# Patient Record
Sex: Male | Born: 1980
Health system: Southern US, Community
[De-identification: ages and names within clinical notes are randomized; demographics above are authoritative.]

## PROBLEM LIST (undated history)

## (undated) DIAGNOSIS — F411 Generalized anxiety disorder: Secondary | ICD-10-CM

## (undated) DIAGNOSIS — E785 Hyperlipidemia, unspecified: Secondary | ICD-10-CM

## (undated) HISTORY — DX: Hyperlipidemia, unspecified: E78.5

---
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
## 2008-06-21 DIAGNOSIS — E785 Hyperlipidemia, unspecified: Secondary | ICD-10-CM

## 2008-06-21 HISTORY — DX: Hyperlipidemia, unspecified: E78.5

## 2013-03-18 ENCOUNTER — Telehealth: Payer: Self-pay | Admitting: Family Medicine

## 2013-03-18 DIAGNOSIS — E785 Hyperlipidemia, unspecified: Secondary | ICD-10-CM

## 2013-03-18 DIAGNOSIS — D751 Secondary polycythemia: Secondary | ICD-10-CM

## 2013-03-18 NOTE — Telephone Encounter (Signed)
Patient needs order for blood work. °

## 2013-03-19 NOTE — Telephone Encounter (Signed)
Bloodwork ordered in system. Patient was notified.  

## 2013-03-19 NOTE — Telephone Encounter (Signed)
Last bloodwork was 07/02/2008 lipid glucose cbc and tsh.  Problem list includes hyperlip. Does he just need lipid and glucose. Not seen in epic. Last visit March 2013. Not taking any meds. Chart in message pile.

## 2013-03-19 NOTE — Telephone Encounter (Signed)
Please do CBC, lipid profile, glucose. Reason: Hyperlipidemia, polycythemia

## 2013-03-21 LAB — CBC WITH DIFFERENTIAL/PLATELET
BASOS ABS: 0 10*3/uL (ref 0.0–0.1)
Basophils Relative: 0 % (ref 0–1)
Eosinophils Absolute: 0.2 10*3/uL (ref 0.0–0.7)
Eosinophils Relative: 2 % (ref 0–5)
HEMATOCRIT: 45.2 % (ref 39.0–52.0)
Hemoglobin: 15.6 g/dL (ref 13.0–17.0)
LYMPHS PCT: 24 % (ref 12–46)
Lymphs Abs: 1.8 10*3/uL (ref 0.7–4.0)
MCH: 30.6 pg (ref 26.0–34.0)
MCHC: 34.5 g/dL (ref 30.0–36.0)
MCV: 88.6 fL (ref 78.0–100.0)
MONO ABS: 0.7 10*3/uL (ref 0.1–1.0)
Monocytes Relative: 9 % (ref 3–12)
NEUTROS PCT: 65 % (ref 43–77)
Neutro Abs: 4.8 10*3/uL (ref 1.7–7.7)
Platelets: 199 10*3/uL (ref 150–400)
RBC: 5.1 MIL/uL (ref 4.22–5.81)
RDW: 13.2 % (ref 11.5–15.5)
WBC: 7.5 10*3/uL (ref 4.0–10.5)

## 2013-03-21 LAB — LIPID PANEL
CHOLESTEROL: 174 mg/dL (ref 0–200)
HDL: 58 mg/dL (ref >39)
LDL CALC: 105 mg/dL — AB (ref 0–99)
TRIGLYCERIDES: 57 mg/dL (ref <150)
Total CHOL/HDL Ratio: 3 Ratio
VLDL: 11 mg/dL (ref 0–40)

## 2013-03-21 LAB — GLUCOSE, RANDOM: Glucose, Bld: 80 mg/dL (ref 70–99)

## 2013-03-22 ENCOUNTER — Encounter: Payer: Self-pay | Admitting: Family Medicine

## 2013-03-25 ENCOUNTER — Ambulatory Visit (INDEPENDENT_AMBULATORY_CARE_PROVIDER_SITE_OTHER): Payer: BC Managed Care – PPO | Admitting: Family Medicine

## 2013-03-25 ENCOUNTER — Encounter: Payer: Self-pay | Admitting: Family Medicine

## 2013-03-25 ENCOUNTER — Other Ambulatory Visit: Payer: Self-pay | Admitting: Family Medicine

## 2013-03-25 VITALS — BP 120/82 | HR 44 | Ht 70.5 in | Wt 181.0 lb

## 2013-03-25 DIAGNOSIS — Z0000 Encounter for general adult medical examination without abnormal findings: Secondary | ICD-10-CM

## 2013-03-25 DIAGNOSIS — B351 Tinea unguium: Secondary | ICD-10-CM

## 2013-03-25 NOTE — Progress Notes (Signed)
   Subjective:    Patient ID: [name], male    DOB: 06-06-1980, 33 y.o.   MRN: [id number]  HPIPhysical.   Fungus on toenails.  This been present for years but worse over the past several months right great toe causes pain discomfort he has tried topical treatments over-the-counter for months without success he is aware of the oral medications and how they can cause and effect on liver but he also states that he feels he needs to do something because of the discomfort. The patient comes in today for a wellness visit.  A review of their health history was completed.  A review of medications was also completed. Any necessary refills were discussed. Sensible healthy diet was discussed. Importance of minimizing excessive salt and carbohydrates was also discussed. Safety was stressed including driving, activities at work and at home where applicable. Importance of regular physical activity for overall health was discussed. Preventative measures appropriate for age were discussed. Time was spent with the patient discussing any concerns they have about their well-being.    Review of Systems  Constitutional: Negative for fever, activity change and appetite change.  HENT: Negative for congestion and rhinorrhea.   Eyes: Negative for discharge.  Respiratory: Negative for cough and wheezing.   Cardiovascular: Negative for chest pain.  Gastrointestinal: Negative for vomiting, abdominal pain and blood in stool.  Genitourinary: Negative for frequency and difficulty urinating.  Musculoskeletal: Negative for neck pain.  Skin: Negative for rash.  Allergic/Immunologic: Negative for environmental allergies and food allergies.  Neurological: Negative for weakness and headaches.  Psychiatric/Behavioral: Negative for agitation.       Objective:   Physical Exam  Constitutional: He appears well-developed and well-nourished.  HENT:  Head: Normocephalic and atraumatic.  Right Ear: External ear  normal.  Left Ear: External ear normal.  Nose: Nose normal.  Mouth/Throat: Oropharynx is clear and moist.  Eyes: EOM are normal. Pupils are equal, round, and reactive to light.  Neck: Normal range of motion. Neck supple. No thyromegaly present.  Cardiovascular: Normal rate, regular rhythm and normal heart sounds.   No murmur heard. Pulmonary/Chest: Effort normal and breath sounds normal. No respiratory distress. He has no wheezes.  Abdominal: Soft. Bowel sounds are normal. He exhibits no distension and no mass. There is no tenderness.  Genitourinary: Penis normal.  Musculoskeletal: Normal range of motion. He exhibits no edema.  Lymphadenopathy:    He has no cervical adenopathy.  Neurological: He is alert. He exhibits normal muscle tone.  Skin: Skin is warm and dry. No erythema.  Psychiatric: He has a normal mood and affect. His behavior is normal. Judgment normal.          Assessment & Plan:  #1 lipid profile looks much better than it did several years ago he needs to continue eating healthy. #2 safety measures dietary measures discussed in detail. #3 toenail fungus-long discussion held regarding this he will do clippings for culture. Then we will consider go ahead and put him on oral medications for this. If we do so he will need to have liver profile checked on regular basis #4 significant calluses on the feet I talked to him about how to help get this to become smaller

## 2013-03-28 LAB — KOH PREP

## 2013-04-04 ENCOUNTER — Ambulatory Visit (INDEPENDENT_AMBULATORY_CARE_PROVIDER_SITE_OTHER): Payer: BC Managed Care – PPO

## 2013-04-04 DIAGNOSIS — Z23 Encounter for immunization: Secondary | ICD-10-CM

## 2013-04-24 LAB — CULTURE, FUNGUS WITHOUT SMEAR

## 2013-04-25 ENCOUNTER — Other Ambulatory Visit: Payer: Self-pay | Admitting: Family Medicine

## 2013-04-25 DIAGNOSIS — B359 Dermatophytosis, unspecified: Secondary | ICD-10-CM

## 2013-04-25 NOTE — Progress Notes (Signed)
Referral put in, please set up and notify patient, thank you

## 2014-03-20 ENCOUNTER — Encounter: Payer: Self-pay | Admitting: Nurse Practitioner

## 2014-03-20 ENCOUNTER — Ambulatory Visit (INDEPENDENT_AMBULATORY_CARE_PROVIDER_SITE_OTHER): Payer: BLUE CROSS/BLUE SHIELD | Admitting: Nurse Practitioner

## 2014-03-20 VITALS — Temp 98.2°F | Ht 70.5 in | Wt 191.2 lb

## 2014-03-20 DIAGNOSIS — J329 Chronic sinusitis, unspecified: Secondary | ICD-10-CM

## 2014-03-20 MED ORDER — AZITHROMYCIN 250 MG PO TABS: ORAL_TABLET | ORAL | Status: DC

## 2014-03-23 ENCOUNTER — Encounter: Payer: Self-pay | Admitting: Nurse Practitioner

## 2014-03-23 NOTE — Progress Notes (Signed)
Subjective:  Presents for c/o sinus symtpoms for the past couple of days. No fever. Nonproductive cough especially early in the am. Runny nose. Sore throat. No headache. No ear pain. No wheezing.   Objective:   Temp(Src) 98.2 F (36.8 C) (Oral)  Ht 5' 10.5" (1.791 m)  Wt 191 lb 3.2 oz (86.728 kg)  BMI 27.04 kg/m2 NAD. Alert, oriented. TMs clear effusion. Pharynx: mild erythema with PND noted. Neck supple with mild anterior adenopathy. Lungs clear. Heart RRR.  Assessment: Rhinosinusitis  Plan:  Meds ordered this encounter  Medications  . azithromycin (ZITHROMAX Z-PAK) 250 MG tablet    Sig: Take 2 tablets (500 mg) on  Day 1,  followed by 1 tablet (250 mg) once daily on Days 2 through 5.    Dispense:  6 each    Refill:  0    Order Specific Question:  Supervising Provider    Answer:  Merlyn AlbertLUKING, WILLIAM S [[id number]]   OTC meds as directed. Call back if worsens or persists.

## 2016-02-29 DIAGNOSIS — Z23 Encounter for immunization: Secondary | ICD-10-CM | POA: Diagnosis not present

## 2016-04-09 DIAGNOSIS — S0993XA Unspecified injury of face, initial encounter: Secondary | ICD-10-CM | POA: Diagnosis not present

## 2016-04-14 DIAGNOSIS — Z4802 Encounter for removal of sutures: Secondary | ICD-10-CM | POA: Diagnosis not present

## 2017-01-23 ENCOUNTER — Encounter: Payer: BLUE CROSS/BLUE SHIELD | Admitting: Family Medicine

## 2017-12-19 ENCOUNTER — Telehealth: Payer: Self-pay | Admitting: Family Medicine

## 2017-12-19 DIAGNOSIS — Z79899 Other long term (current) drug therapy: Secondary | ICD-10-CM

## 2017-12-19 DIAGNOSIS — Z13220 Encounter for screening for lipoid disorders: Secondary | ICD-10-CM

## 2017-12-19 NOTE — Telephone Encounter (Signed)
Last labs done 2015 lipid and cbc

## 2017-12-19 NOTE — Telephone Encounter (Signed)
Pt has CPE on 11/19. He would like to have lab work done before his appt.

## 2017-12-20 NOTE — Telephone Encounter (Signed)
Lipid met 7

## 2017-12-20 NOTE — Telephone Encounter (Signed)
Pt.notified

## 2017-12-20 NOTE — Telephone Encounter (Signed)
Orders sent and I called and left a vm for the pt to r/c.

## 2017-12-28 DIAGNOSIS — Z79899 Other long term (current) drug therapy: Secondary | ICD-10-CM | POA: Diagnosis not present

## 2017-12-28 DIAGNOSIS — Z13220 Encounter for screening for lipoid disorders: Secondary | ICD-10-CM | POA: Diagnosis not present

## 2017-12-29 LAB — LIPID PANEL
CHOL/HDL RATIO: 4.1 ratio (ref 0.0–5.0)
Cholesterol, Total: 229 mg/dL — ABNORMAL HIGH (ref 100–199)
HDL: 56 mg/dL (ref >39)
LDL CALC: 156 mg/dL — AB (ref 0–99)
Triglycerides: 83 mg/dL (ref 0–149)
VLDL CHOLESTEROL CAL: 17 mg/dL (ref 5–40)

## 2017-12-29 LAB — BASIC METABOLIC PANEL
BUN/Creatinine Ratio: 13 (ref 9–20)
BUN: 14 mg/dL (ref 6–20)
CHLORIDE: 104 mmol/L (ref 96–106)
CO2: 24 mmol/L (ref 20–29)
Calcium: 10 mg/dL (ref 8.7–10.2)
Creatinine, Ser: 1.05 mg/dL (ref 0.76–1.27)
GFR calc Af Amer: 107 mL/min/{1.73_m2} (ref >59)
GFR calc non Af Amer: 92 mL/min/{1.73_m2} (ref >59)
GLUCOSE: 84 mg/dL (ref 65–99)
Potassium: 4.5 mmol/L (ref 3.5–5.2)
SODIUM: 142 mmol/L (ref 134–144)

## 2018-01-09 ENCOUNTER — Ambulatory Visit (INDEPENDENT_AMBULATORY_CARE_PROVIDER_SITE_OTHER): Payer: BLUE CROSS/BLUE SHIELD | Admitting: Family Medicine

## 2018-01-09 ENCOUNTER — Encounter: Payer: Self-pay | Admitting: Family Medicine

## 2018-01-09 VITALS — BP 118/68 | Ht 71.0 in | Wt 187.0 lb

## 2018-01-09 DIAGNOSIS — Z0000 Encounter for general adult medical examination without abnormal findings: Secondary | ICD-10-CM | POA: Diagnosis not present

## 2018-01-09 DIAGNOSIS — Z23 Encounter for immunization: Secondary | ICD-10-CM | POA: Diagnosis not present

## 2018-01-09 NOTE — Patient Instructions (Addendum)
Results for orders placed or performed in visit on 12/19/17  Lipid panel  Result Value Ref Range   Cholesterol, Total 229 (H) 100 - 199 mg/dL   Triglycerides 83 0 - 149 mg/dL   HDL 56 >91>39 mg/dL   VLDL Cholesterol Cal 17 5 - 40 mg/dL   LDL Calculated 478156 (H) 0 - 99 mg/dL   Chol/HDL Ratio 4.1 0.0 - 5.0 ratio  Basic metabolic panel  Result Value Ref Range   Glucose 84 65 - 99 mg/dL   BUN 14 6 - 20 mg/dL   Creatinine, Ser 2.951.05 0.76 - 1.27 mg/dL   GFR calc non Af Amer 92 >59 mL/min/1.73   GFR calc Af Amer 107 >59 mL/min/1.73   BUN/Creatinine Ratio 13 9 - 20   Sodium 142 134 - 144 mmol/L   Potassium 4.5 3.5 - 5.2 mmol/L   Chloride 104 96 - 106 mmol/L   CO2 24 20 - 29 mmol/L   Calcium 10.0 8.7 - 10.2 mg/dL    wellness within 3 years Insurance will pay for yearly  Lipid profile in 1 year

## 2018-01-09 NOTE — Progress Notes (Signed)
   Subjective:    Patient ID: Rickey Wolfe, male    DOB: 1980-12-02, 37 y.o.   MRN: [id number]  HPI  The patient comes in today for a wellness visit.  Patient plays a lot of basketball Tries to eat healthy for the most part Does not eat too much fast food Denies any major issues currently Does not drink much in way of alcohol Does not smoke    A review of their health history was completed.  A review of medications was also completed.  Any needed refills; none  Eating habits: trying to do good  Falls/  MVA accidents in past few months: none  Regular exercise: yes  Specialist pt sees on regular basis: none  Preventative health issues were discussed.   Additional concerns: none   Review of Systems  Constitutional: Negative for diaphoresis and fatigue.  HENT: Negative for congestion and rhinorrhea.   Respiratory: Negative for cough and shortness of breath.   Cardiovascular: Negative for chest pain and leg swelling.  Gastrointestinal: Negative for abdominal pain and diarrhea.  Skin: Negative for color change and rash.  Neurological: Negative for dizziness and headaches.  Psychiatric/Behavioral: Negative for behavioral problems and confusion.       Objective:   Physical Exam  Constitutional: He appears well-nourished. No distress.  HENT:  Head: Normocephalic and atraumatic.  Eyes: Right eye exhibits no discharge. Left eye exhibits no discharge.  Neck: No tracheal deviation present.  Cardiovascular: Normal rate, regular rhythm and normal heart sounds.  No murmur heard. Pulmonary/Chest: Effort normal and breath sounds normal. No respiratory distress.  Musculoskeletal: He exhibits no edema.  Lymphadenopathy:    He has no cervical adenopathy.  Neurological: He is alert. Coordination normal.  Skin: Skin is warm and dry.  Psychiatric: He has a normal mood and affect. His behavior is normal.  Vitals reviewed.         Assessment & Plan:  Adult  wellness-complete.wellness physical was conducted today. Importance of diet and exercise were discussed in detail.  In addition to this a discussion regarding safety was also covered. We also reviewed over immunizations and gave recommendations regarding current immunization needed for age.  In addition to this additional areas were also touched on including: Preventative health exams needed:  Colonoscopy not indicated, prostate exam not indicated  Patient was advised yearly wellness exam  Patient will watch his diet closely try to bring his cholesterol down I recommend he recheck it again in 1 year hold off on medications  Patient was encouraged to get a wellness at least every few years and I told him that his insurance covers it yearly so therefore we could do it in 1 year he will consider it

## 2018-04-01 DIAGNOSIS — R110 Nausea: Secondary | ICD-10-CM | POA: Diagnosis not present

## 2018-04-01 DIAGNOSIS — R112 Nausea with vomiting, unspecified: Secondary | ICD-10-CM | POA: Diagnosis not present

## 2018-04-01 DIAGNOSIS — R197 Diarrhea, unspecified: Secondary | ICD-10-CM | POA: Diagnosis not present

## 2018-06-25 ENCOUNTER — Other Ambulatory Visit: Payer: Self-pay

## 2018-06-25 ENCOUNTER — Ambulatory Visit (INDEPENDENT_AMBULATORY_CARE_PROVIDER_SITE_OTHER): Payer: BLUE CROSS/BLUE SHIELD | Admitting: Family Medicine

## 2018-06-25 ENCOUNTER — Encounter: Payer: Self-pay | Admitting: Family Medicine

## 2018-06-25 VITALS — Wt 168.0 lb

## 2018-06-25 DIAGNOSIS — Z566 Other physical and mental strain related to work: Secondary | ICD-10-CM

## 2018-06-25 DIAGNOSIS — F411 Generalized anxiety disorder: Secondary | ICD-10-CM | POA: Diagnosis not present

## 2018-06-25 MED ORDER — FLUOXETINE HCL 10 MG PO CAPS: 10.0000 mg | ORAL_CAPSULE | Freq: Every day | ORAL | 2 refills | Status: DC

## 2018-06-25 MED ORDER — ALPRAZOLAM 0.25 MG PO TABS: 0.2500 mg | ORAL_TABLET | Freq: Two times a day (BID) | ORAL | 1 refills | Status: DC | PRN

## 2018-06-25 NOTE — Progress Notes (Signed)
   Subjective:    Patient ID: Rickey Wolfe, male    DOB: Jan 26, 1981, 38 y.o.   MRN: [id number]  Anxiety  Presents for initial visit. Onset was in the past 7 days. Symptoms include nervous/anxious behavior and restlessness. Primary symptoms comment: pt does have some worry. The quality of sleep is fair. Nighttime awakenings: occasional (pt does have young children but can fall back to sleep).   Risk factors: work issues.  Patient having a difficult time interpersonally with 1 another worker There hopefully resolving things to.  This worker will no longer be there to be around him He does not feel threatened but he does get harassed at times by this person Patient relates that he finds himself dreading going to work he finds himself feeling anxious nervous and feels like heart runs fast at times breathing fast at times denies high fever chills nausea vomiting diarrhea denies being depressed but does state he has a hard time shutting down the worry.  He states when he is at home things are going well Virtual Visit via Video Note  I connected with Rickey Wolfe on 06/25/18 at  1:40 PM EDT by a video enabled telemedicine application and verified that I am speaking with the correct person using two identifiers.  Location: Patient: home Provider: office   I discussed the limitations of evaluation and management by telemedicine and the availability of in person appointments. The patient expressed understanding and agreed to proceed.  History of Present Illness:    Observations/Objective:   Assessment and Plan:   Follow Up Instructions:    I discussed the assessment and treatment plan with the patient. The patient was provided an opportunity to ask questions and all were answered. The patient agreed with the plan and demonstrated an understanding of the instructions.   The patient was advised to call back or seek an in-person evaluation if the symptoms worsen or if the condition fails to  improve as anticipated.  I provided 17 minutes of non-face-to-face time during this encounter.   Marlowe [place], LPN    Review of Systems  Psychiatric/Behavioral: The patient is nervous/anxious.        Objective:   Physical Exam        Assessment & Plan:  Generalized anxiety disorder Significant amount of stress No true depression symptoms I did discuss with him short-term use of Xanax Also some relaxation techniques Plus also avoidance of the stressor We also talked about how serotonin reuptake inhibitors can help with this type of problem he would like to try medication Therefore Prozac 10 mg he was warned that it can cause nausea and if it start making him feel worse or depressed to stop the medicine Follow-up visit within 3 weeks Xanax use for short-term only caution drowsiness.

## 2018-07-10 ENCOUNTER — Other Ambulatory Visit: Payer: Self-pay

## 2018-07-10 ENCOUNTER — Ambulatory Visit: Payer: BLUE CROSS/BLUE SHIELD | Admitting: Family Medicine

## 2018-09-24 ENCOUNTER — Encounter: Payer: Self-pay | Admitting: Family Medicine

## 2018-09-24 ENCOUNTER — Other Ambulatory Visit: Payer: Self-pay

## 2018-09-24 ENCOUNTER — Ambulatory Visit (INDEPENDENT_AMBULATORY_CARE_PROVIDER_SITE_OTHER): Payer: BC Managed Care – PPO | Admitting: Family Medicine

## 2018-09-24 DIAGNOSIS — F411 Generalized anxiety disorder: Secondary | ICD-10-CM | POA: Diagnosis not present

## 2018-09-24 HISTORY — DX: Generalized anxiety disorder: F41.1

## 2018-09-24 MED ORDER — FLUOXETINE HCL 10 MG PO CAPS: 10.0000 mg | ORAL_CAPSULE | Freq: Every day | ORAL | 2 refills | Status: DC

## 2018-09-24 NOTE — Progress Notes (Signed)
   Subjective:    Patient ID: Rickey Wolfe, male    DOB: [birth year], 38 y.o.   MRN: [id number]  HPIFollow up on anxiety and depression. Takes xanax 0.25mg  one bid and prozac 10mg  one daily. Pt states meds are doing well. Gad 7 and phq9 were done today. Pt states no concerns today.   Patient denies being depressed currently states symptoms are doing better.  Less anxious.  Medication is helping.  Sleeping okay.  Denies any other setbacks. GAD 7 : Generalized Anxiety Score 09/24/2018  Nervous, Anxious, on Edge 1  Control/stop worrying 0  Worry too much - different things 0  Trouble relaxing 0  Restless 0  Easily annoyed or irritable 0  Afraid - awful might happen 0  Total GAD 7 Score 1      Office Visit from 09/24/2018 in [place]  PHQ-9 Total Score  2      Virtual Visit via Telephone Note  I connected with Rickey Wolfe on 09/24/18 at  8:30 AM EDT by telephone and verified that I am speaking with the correct person using two identifiers.  Location: Patient: home Provider: office   I discussed the limitations, risks, security and privacy concerns of performing an evaluation and management service by telephone and the availability of in person appointments. I also discussed with the patient that there may be a patient responsible charge related to this service. The patient expressed understanding and agreed to proceed.   History of Present Illness:    Observations/Objective:   Assessment and Plan:   Follow Up Instructions:    I discussed the assessment and treatment plan with the patient. The patient was provided an opportunity to ask questions and all were answered. The patient agreed with the plan and demonstrated an understanding of the instructions.   The patient was advised to call back or seek an in-person evaluation if the symptoms worsen or if the condition fails to improve as anticipated.  I provided 15 minutes of non-face-to-face time during  this encounter.       Review of Systems  Constitutional: Negative for activity change.  HENT: Negative for congestion and rhinorrhea.   Respiratory: Negative for cough and shortness of breath.   Cardiovascular: Negative for chest pain.  Gastrointestinal: Negative for abdominal pain, diarrhea, nausea and vomiting.  Genitourinary: Negative for dysuria and hematuria.  Neurological: Negative for weakness and headaches.  Psychiatric/Behavioral: Negative for behavioral problems and confusion.       Objective:   Physical Exam  Today's visit was via telephone Physical exam was not possible for this visit 15 minutes was spent with patient today discussing healthcare issues which they came.  More than 50% of this visit-total duration of visit-was spent in counseling and coordination of care.  Please see diagnosis regarding the focus of this coordination and care       Assessment & Plan:  Mild generalized anxiety disorder Patient overall doing really well Not depressed Stress levels are much better He is tolerating the medicine well and states that helps him function well he uses the Xanax very rarely he is interested in continuing with the medication We had a good discussion and therefore we will stick with the medication into spring with a follow-up visit in April Certainly if he has progressive symptoms are worse he will follow-up sooner or notify us by phone Refills of medication given

## 2018-12-03 DIAGNOSIS — Z20828 Contact with and (suspected) exposure to other viral communicable diseases: Secondary | ICD-10-CM | POA: Diagnosis not present

## 2018-12-03 DIAGNOSIS — Z6824 Body mass index (BMI) 24.0-24.9, adult: Secondary | ICD-10-CM | POA: Diagnosis not present

## 2018-12-05 ENCOUNTER — Other Ambulatory Visit: Payer: Self-pay | Admitting: *Deleted

## 2018-12-05 DIAGNOSIS — Z20822 Contact with and (suspected) exposure to covid-19: Secondary | ICD-10-CM

## 2018-12-06 LAB — NOVEL CORONAVIRUS, NAA: SARS-CoV-2, NAA: NOT DETECTED

## 2018-12-10 DIAGNOSIS — Z23 Encounter for immunization: Secondary | ICD-10-CM | POA: Diagnosis not present

## 2019-02-05 ENCOUNTER — Other Ambulatory Visit: Payer: Self-pay | Admitting: Family Medicine

## 2019-02-05 ENCOUNTER — Telehealth: Payer: Self-pay | Admitting: Family Medicine

## 2019-02-05 MED ORDER — ALPRAZOLAM 0.25 MG PO TABS: 0.2500 mg | ORAL_TABLET | Freq: Two times a day (BID) | ORAL | 1 refills | Status: DC | PRN

## 2019-02-05 NOTE — Telephone Encounter (Signed)
Refill was sent to the [facility] on [address]

## 2019-02-05 NOTE — Telephone Encounter (Signed)
Pt had one refill left on ALPRAZolam (XANAX) 0.25 MG tablet. He never got it filled and it expired. He wants to know if Dr. Nicki Reaper would refill for him.   [id number] - [place], [place] [address]

## 2019-02-05 NOTE — Telephone Encounter (Signed)
Pt last seen 10/21/2018 for GAD. Please advise. Thank you

## 2019-03-04 DIAGNOSIS — Z20828 Contact with and (suspected) exposure to other viral communicable diseases: Secondary | ICD-10-CM | POA: Diagnosis not present

## 2019-03-04 DIAGNOSIS — Z03818 Encounter for observation for suspected exposure to other biological agents ruled out: Secondary | ICD-10-CM | POA: Diagnosis not present

## 2019-04-29 ENCOUNTER — Ambulatory Visit: Payer: Self-pay | Attending: Internal Medicine

## 2019-04-29 DIAGNOSIS — Z23 Encounter for immunization: Secondary | ICD-10-CM | POA: Insufficient documentation

## 2019-04-29 NOTE — Progress Notes (Signed)
   Covid-19 Vaccination Clinic  Name:  TAMEEM PULLARA    MRN: [id number] DOB: January 19, 1981  04/29/2019  Mr. Frenette was observed post Covid-19 immunization for 15 minutes without incident. He was provided with Vaccine Information Sheet and instruction to access the V-Safe system.   Mr. Wagar was instructed to call 911 with any severe reactions post vaccine: Marland Kitchen Difficulty breathing  . Swelling of face and throat  . A fast heartbeat  . A bad rash all over body  . Dizziness and weakness   Immunizations Administered    Name Date Dose VIS Date Route   Pfizer COVID-19 Vaccine 04/29/2019  5:39 PM 0.3 mL 02/01/2019 Intramuscular   Manufacturer: [employer], [employer]   Lot: [id number]   NDC: [address]-8

## 2019-05-20 ENCOUNTER — Ambulatory Visit: Payer: Self-pay | Attending: Internal Medicine

## 2019-05-20 DIAGNOSIS — Z23 Encounter for immunization: Secondary | ICD-10-CM

## 2019-05-20 NOTE — Progress Notes (Signed)
   Covid-19 Vaccination Clinic  Name:  Rickey Wolfe    MRN: [id number] DOB: 09/19/1980  05/20/2019  Mr. [name] was observed post Covid-19 immunization for 15 minutes without incident. He was provided with Vaccine Information Sheet and instruction to access the V-Safe system.   Mr. [name] was instructed to call 911 with any severe reactions post vaccine: Marland Kitchen Difficulty breathing  . Swelling of face and throat  . A fast heartbeat  . A bad rash all over body  . Dizziness and weakness   Immunizations Administered    Name Date Dose VIS Date Route   Pfizer COVID-19 Vaccine 05/20/2019  9:16 AM 0.3 mL 02/01/2019 Intramuscular   Manufacturer: [employer], [employer]   Lot: [id number]   NDC: [address]-0

## 2019-06-07 ENCOUNTER — Telehealth: Payer: Self-pay | Admitting: Family Medicine

## 2019-06-07 MED ORDER — FLUOXETINE HCL 10 MG PO CAPS: 10.0000 mg | ORAL_CAPSULE | Freq: Every day | ORAL | 1 refills | Status: DC

## 2019-06-07 NOTE — Telephone Encounter (Signed)
May have 2 months needs to schedule ov follow up within 2 months

## 2019-06-07 NOTE — Telephone Encounter (Signed)
Patient is requesting refill on fluoxetine 10 mg. He was last seen 09/24/18 /medication last filled the same day. [facility]

## 2019-06-07 NOTE — Telephone Encounter (Signed)
Prescription sent electronically to pharmacy. Patient notified and scheduled folow up office visit with Dr Lorin Picket.

## 2019-07-02 ENCOUNTER — Encounter: Payer: Self-pay | Admitting: Family Medicine

## 2019-07-02 ENCOUNTER — Other Ambulatory Visit: Payer: Self-pay

## 2019-07-02 ENCOUNTER — Telehealth: Payer: Self-pay | Admitting: *Deleted

## 2019-07-02 ENCOUNTER — Telehealth (INDEPENDENT_AMBULATORY_CARE_PROVIDER_SITE_OTHER): Payer: BC Managed Care – PPO | Admitting: Family Medicine

## 2019-07-02 DIAGNOSIS — Z566 Other physical and mental strain related to work: Secondary | ICD-10-CM | POA: Diagnosis not present

## 2019-07-02 DIAGNOSIS — F411 Generalized anxiety disorder: Secondary | ICD-10-CM | POA: Diagnosis not present

## 2019-07-02 MED ORDER — FLUOXETINE HCL 10 MG PO CAPS: 10.0000 mg | ORAL_CAPSULE | Freq: Every day | ORAL | 3 refills | Status: DC

## 2019-07-02 NOTE — Progress Notes (Signed)
   Subjective:    Patient ID: Rickey Wolfe, male    DOB: 01-Mar-1980, 39 y.o.   MRN: [id number]  HPI Patient calls today for a medication follow up. Patient requesting refill for Xanax and Fluoxetine. Stress levels are doing well he is tolerating the medicine well he would like to taper off of the Prozac over the summertime He rarely uses Xanax at all Denies being depressed States stress at work is much less   Telemedicine from 07/02/2019 in [facility]  PHQ-9 Total Score  1      See PHQ9 and GAD 7. GAD 7 : Generalized Anxiety Score 07/02/2019 09/24/2018  Nervous, Anxious, on Edge 1 1  Control/stop worrying 0 0  Worry too much - different things 0 0  Trouble relaxing 1 0  Restless 0 0  Easily annoyed or irritable 0 0  Afraid - awful might happen 0 0  Total GAD 7 Score 2 1  Anxiety Difficulty Not difficult at all -     Review of Systems  Constitutional: Negative for activity change, fatigue and fever.  HENT: Negative for congestion and rhinorrhea.   Respiratory: Negative for cough and shortness of breath.   Cardiovascular: Negative for chest pain and leg swelling.  Gastrointestinal: Negative for abdominal pain, diarrhea and nausea.  Genitourinary: Negative for dysuria and hematuria.  Neurological: Negative for weakness and headaches.  Psychiatric/Behavioral: Negative for agitation and behavioral problems.       Objective:   Physical Exam   Virtual Visit via Video Note  I connected with Rickey Wolfe on 07/02/19 at  8:40 AM EDT by a video enabled telemedicine application and verified that I am speaking with the correct person using two identifiers.  Location: Patient: Home Provider: office   I discussed the limitations of evaluation and management by telemedicine and the availability of in person appointments. The patient expressed understanding and agreed to proceed.  History of Present Illness:    Observations/Objective:   Assessment and  Plan:   Follow Up Instructions:    I discussed the assessment and treatment plan with the patient. The patient was provided an opportunity to ask questions and all were answered. The patient agreed with the plan and demonstrated an understanding of the instructions.   The patient was advised to call back or seek an in-person evaluation if the symptoms worsen or if the condition fails to improve as anticipated.  I provided 15 minutes of non-face-to-face time during this encounter.  Wellness recommended in the summer    Assessment & Plan:  Stress at work doing much better Denies being depressed GAD doing better Tolerating medications well Would like to taper off in the summer He states more likely he will stop the medicine mid June and notify us a few weeks later We did discuss restarting medicine should his symptoms recur Patient already has some Xanax in case he does have an occasional flareup caution drowsiness

## 2019-07-02 NOTE — Telephone Encounter (Signed)
Mr. Rickey Wolfe, Rickey Wolfe are scheduled for a virtual visit with your provider today.    Just as we do with appointments in the office, we must obtain your consent to participate.  Your consent will be active for this visit and any virtual visit you may have with one of our providers in the next 365 days.    If you have a MyChart account, I can also send a copy of this consent to you electronically.  All virtual visits are billed to your insurance company just like a traditional visit in the office.  As this is a virtual visit, video technology does not allow for your provider to perform a traditional examination.  This may limit your provider's ability to fully assess your condition.  If your provider identifies any concerns that need to be evaluated in person or the need to arrange testing such as labs, EKG, etc, we will make arrangements to do so.    Although advances in technology are sophisticated, we cannot ensure that it will always work on either your end or our end.  If the connection with a video visit is poor, we may have to switch to a telephone visit.  With either a video or telephone visit, we are not always able to ensure that we have a secure connection.   I need to obtain your verbal consent now.   Are you willing to proceed with your visit today?   Rickey Wolfe has provided verbal consent on 07/02/2019 for a virtual visit (video or telephone).   Rickey Rushing, LPN 1/96/2229  7:98 AM

## 2019-08-07 ENCOUNTER — Other Ambulatory Visit: Payer: Self-pay | Admitting: *Deleted

## 2019-08-07 MED ORDER — ALPRAZOLAM 0.25 MG PO TABS: 0.2500 mg | ORAL_TABLET | Freq: Two times a day (BID) | ORAL | 1 refills | Status: DC | PRN

## 2019-09-05 DIAGNOSIS — Z03818 Encounter for observation for suspected exposure to other biological agents ruled out: Secondary | ICD-10-CM | POA: Diagnosis not present

## 2019-09-05 DIAGNOSIS — Z20822 Contact with and (suspected) exposure to covid-19: Secondary | ICD-10-CM | POA: Diagnosis not present

## 2019-11-10 ENCOUNTER — Other Ambulatory Visit: Payer: Self-pay | Admitting: Family Medicine

## 2019-12-23 ENCOUNTER — Other Ambulatory Visit: Payer: Self-pay | Admitting: *Deleted

## 2019-12-23 ENCOUNTER — Encounter: Payer: Self-pay | Admitting: Family Medicine

## 2019-12-23 MED ORDER — FLUOXETINE HCL 10 MG PO CAPS: ORAL_CAPSULE | ORAL | 0 refills | Status: DC

## 2019-12-23 NOTE — Telephone Encounter (Signed)
Last med check up 07/02/19

## 2019-12-23 NOTE — Telephone Encounter (Signed)
Nurses Patient may have 90-day prescription I would like to do a follow-up visit with him within the next 2 months (typically for this medication would like to do follow-ups every 6 months-his last visit was May) Please encourage Rickey Wolfe to set up a follow-up visit within that timeframe either in person or virtual thank you-Dr. Lorin Picket

## 2020-02-24 ENCOUNTER — Telehealth (INDEPENDENT_AMBULATORY_CARE_PROVIDER_SITE_OTHER): Payer: BC Managed Care – PPO | Admitting: Family Medicine

## 2020-02-24 ENCOUNTER — Other Ambulatory Visit: Payer: Self-pay

## 2020-02-24 ENCOUNTER — Telehealth: Payer: Self-pay | Admitting: Family Medicine

## 2020-02-24 DIAGNOSIS — F411 Generalized anxiety disorder: Secondary | ICD-10-CM | POA: Diagnosis not present

## 2020-02-24 MED ORDER — FLUOXETINE HCL 10 MG PO CAPS
ORAL_CAPSULE | ORAL | 1 refills | Status: DC
Start: 2020-02-24 — End: 2020-09-28

## 2020-02-24 NOTE — Progress Notes (Signed)
   Subjective:    Patient ID: Rickey Wolfe, male    DOB: 1980-08-25, 40 y.o.   MRN: [id number]  HPI Pt following up on medications. Pt taking Xanax and Prozac. Pt doing well on both meds. GAD 7 and PHQ-9 completed.  GAD 7 : Generalized Anxiety Score 02/24/2020 07/02/2019 09/24/2018  Nervous, Anxious, on Edge 0 1 1  Control/stop worrying 0 0 0  Worry too much - different things 1 0 0  Trouble relaxing 0 1 0  Restless 0 0 0  Easily annoyed or irritable 0 0 0  Afraid - awful might happen 1 0 0  Total GAD 7 Score 2 2 1   Anxiety Difficulty Not difficult at all Not difficult at all -    Flowsheet Row Telemedicine from 02/24/2020 in [facility]  PHQ-9 Total Score 1    Patient feels medicine helping a lot. Denies any major setbacks lately.  Virtual Visit via Telephone Note  I connected with Rickey Wolfe on 02/24/20 at 10:00 AM EST by telephone and verified that I am speaking with the correct person using two identifiers.  Location: Patient: home Provider: office   I discussed the limitations, risks, security and privacy concerns of performing an evaluation and management service by telephone and the availability of in person appointments. I also discussed with the patient that there may be a patient responsible charge related to this service. The patient expressed understanding and agreed to proceed.   History of Present Illness:    Observations/Objective:   Assessment and Plan:   Follow Up Instructions:    I discussed the assessment and treatment plan with the patient. The patient was provided an opportunity to ask questions and all were answered. The patient agreed with the plan and demonstrated an understanding of the instructions.   The patient was advised to call back or seek an in-person evaluation if the symptoms worsen or if the condition fails to improve as anticipated.  I provided 15 minutes of non-face-to-face time during this  encounter.      Review of Systems     Objective:   Physical Exam    Today's visit was via telephone Physical exam was not possible for this visit Due to inclement weather the visit was switched to virtual    Assessment & Plan:  Generalized anxiety does well with the medicine denies any major setbacks not using Xanax currently taking Prozac every single day he will continue this in through summertime then decide if he wants to continue taking it for back off on it he will let 04/23/20 know follow-up in the summer

## 2020-02-24 NOTE — Telephone Encounter (Signed)
Rickey Wolfe, Rickey Wolfe are scheduled for a virtual visit with your provider today.    Just as we do with appointments in the office, we must obtain your consent to participate.  Your consent will be active for this visit and any virtual visit you may have with one of our providers in the next 365 days.    If you have a MyChart account, I can also send a copy of this consent to you electronically.  All virtual visits are billed to your insurance company just like a traditional visit in the office.  As this is a virtual visit, video technology does not allow for your provider to perform a traditional examination.  This may limit your provider's ability to fully assess your condition.  If your provider identifies any concerns that need to be evaluated in person or the need to arrange testing such as labs, EKG, etc, we will make arrangements to do so.    Although advances in technology are sophisticated, we cannot ensure that it will always work on either your end or our end.  If the connection with a video visit is poor, we may have to switch to a telephone visit.  With either a video or telephone visit, we are not always able to ensure that we have a secure connection.   I need to obtain your verbal consent now.   Are you willing to proceed with your visit today?   Rickey Wolfe has provided verbal consent on 02/24/2020 for a virtual visit (video or telephone).   Marlowe Shores, LPN 08/30/8919  1:94 AM

## 2020-08-25 DIAGNOSIS — G8929 Other chronic pain: Secondary | ICD-10-CM | POA: Diagnosis not present

## 2020-08-25 DIAGNOSIS — M25361 Other instability, right knee: Secondary | ICD-10-CM | POA: Diagnosis not present

## 2020-08-25 DIAGNOSIS — M25561 Pain in right knee: Secondary | ICD-10-CM | POA: Diagnosis not present

## 2020-09-08 ENCOUNTER — Other Ambulatory Visit: Payer: Self-pay | Admitting: Orthopedic Surgery

## 2020-09-08 DIAGNOSIS — R531 Weakness: Secondary | ICD-10-CM

## 2020-09-08 DIAGNOSIS — R52 Pain, unspecified: Secondary | ICD-10-CM

## 2020-09-08 DIAGNOSIS — M2391 Unspecified internal derangement of right knee: Secondary | ICD-10-CM

## 2020-09-18 ENCOUNTER — Other Ambulatory Visit: Payer: Self-pay

## 2020-09-18 ENCOUNTER — Ambulatory Visit
Admission: RE | Admit: 2020-09-18 | Discharge: 2020-09-18 | Disposition: A | Discharge status: Home / Self Care | Payer: BC Managed Care – PPO | Source: Ambulatory Visit | Attending: Orthopedic Surgery | Admitting: Orthopedic Surgery

## 2020-09-18 DIAGNOSIS — M2391 Unspecified internal derangement of right knee: Secondary | ICD-10-CM

## 2020-09-18 DIAGNOSIS — M25561 Pain in right knee: Secondary | ICD-10-CM | POA: Diagnosis not present

## 2020-09-18 DIAGNOSIS — R531 Weakness: Secondary | ICD-10-CM

## 2020-09-18 DIAGNOSIS — R52 Pain, unspecified: Secondary | ICD-10-CM

## 2020-09-28 ENCOUNTER — Telehealth: Payer: Self-pay | Admitting: Family Medicine

## 2020-09-28 MED ORDER — FLUOXETINE HCL 10 MG PO CAPS: ORAL_CAPSULE | ORAL | 0 refills | Status: DC

## 2020-09-28 NOTE — Telephone Encounter (Signed)
30 tablets sent to pharmacy with note needs visit

## 2020-09-28 NOTE — Telephone Encounter (Signed)
[facility] [address] requesting refill on Fluoxetine 10 mg capsules. Take one capsule po daily. Pt last visit via phone 02/24/20 for GAD. Please advise. Thank you

## 2020-09-28 NOTE — Telephone Encounter (Signed)
May have 30 day needs ov

## 2020-09-28 NOTE — Addendum Note (Signed)
Addended by: Marlowe Shores on: 09/28/2020 11:14 AM   Modules accepted: Orders

## 2020-10-21 DIAGNOSIS — Y999 Unspecified external cause status: Secondary | ICD-10-CM | POA: Diagnosis not present

## 2020-10-21 DIAGNOSIS — G8918 Other acute postprocedural pain: Secondary | ICD-10-CM | POA: Diagnosis not present

## 2020-10-21 DIAGNOSIS — M659 Synovitis and tenosynovitis, unspecified: Secondary | ICD-10-CM | POA: Diagnosis not present

## 2020-10-21 DIAGNOSIS — X58XXXA Exposure to other specified factors, initial encounter: Secondary | ICD-10-CM | POA: Diagnosis not present

## 2020-10-21 DIAGNOSIS — M6751 Plica syndrome, right knee: Secondary | ICD-10-CM | POA: Diagnosis not present

## 2020-10-21 DIAGNOSIS — S83231A Complex tear of medial meniscus, current injury, right knee, initial encounter: Secondary | ICD-10-CM | POA: Diagnosis not present

## 2020-11-02 ENCOUNTER — Other Ambulatory Visit: Payer: Self-pay | Admitting: Family Medicine

## 2020-11-03 NOTE — Telephone Encounter (Signed)
Sent my chart message to schedule appointment.

## 2020-11-11 ENCOUNTER — Encounter: Payer: Self-pay | Admitting: Family Medicine

## 2020-11-11 ENCOUNTER — Ambulatory Visit (INDEPENDENT_AMBULATORY_CARE_PROVIDER_SITE_OTHER): Payer: BC Managed Care – PPO | Admitting: Family Medicine

## 2020-11-11 ENCOUNTER — Other Ambulatory Visit: Payer: Self-pay

## 2020-11-11 VITALS — BP 116/72 | HR 59 | Temp 97.3°F | Wt 193.2 lb

## 2020-11-11 DIAGNOSIS — Z131 Encounter for screening for diabetes mellitus: Secondary | ICD-10-CM

## 2020-11-11 DIAGNOSIS — F411 Generalized anxiety disorder: Secondary | ICD-10-CM | POA: Diagnosis not present

## 2020-11-11 DIAGNOSIS — Z13220 Encounter for screening for lipoid disorders: Secondary | ICD-10-CM

## 2020-11-11 MED ORDER — FLUOXETINE HCL 10 MG PO CAPS
ORAL_CAPSULE | ORAL | 1 refills | Status: DC
Start: 2020-11-11 — End: 2021-05-13

## 2020-11-11 MED ORDER — ALPRAZOLAM 0.25 MG PO TABS: 0.2500 mg | ORAL_TABLET | Freq: Two times a day (BID) | ORAL | 0 refills | Status: DC | PRN

## 2020-11-11 NOTE — Telephone Encounter (Signed)
Patient had appointment today for medication follow up 9/22

## 2020-11-11 NOTE — Progress Notes (Signed)
   Subjective:    Patient ID: Rickey Wolfe, male    DOB: 07-Jul-1980, 40 y.o.   MRN: [id number]  HPI Pt here for follow up on anxiety. Pt doing well on Xanax and Prozac.  Patient uses Xanax very rarely Uses Prozac on a daily basis States moods overall are doing fairly well Stress levels are reasonable He would like to continue the medication at least until May 2023 then he states he is interested in potentially tapering off of it if things are going well  Review of Systems     Objective:   Physical Exam  Lungs clear heart regular pulse normal extremities no edema      Assessment & Plan:   Has a family history of hyperlipidemia and also previous lipid profile several years ago elevated he will repeat his lipid profile along with a glucose  Healthy eating regular physical activity recommended  Stress levels are doing well, GAD under good control, continue Prozac per patient request uses Xanax sparingly caution drowsiness not for use if operating heavy machinery  Follow-up by late spring sooner if any problems

## 2021-05-12 ENCOUNTER — Other Ambulatory Visit: Payer: Self-pay | Admitting: Family Medicine

## 2021-05-12 NOTE — Telephone Encounter (Signed)
May have 90 but needs to do a follow-up visit please ?

## 2021-05-13 ENCOUNTER — Other Ambulatory Visit: Payer: Self-pay | Admitting: *Deleted

## 2021-05-13 MED ORDER — FLUOXETINE HCL 10 MG PO CAPS: ORAL_CAPSULE | ORAL | 0 refills | Status: DC

## 2021-08-18 ENCOUNTER — Encounter: Payer: Self-pay | Admitting: Family Medicine

## 2021-08-19 ENCOUNTER — Other Ambulatory Visit: Payer: Self-pay | Admitting: Family Medicine

## 2021-08-20 ENCOUNTER — Other Ambulatory Visit: Payer: Self-pay | Admitting: Family Medicine

## 2021-09-16 ENCOUNTER — Other Ambulatory Visit: Payer: Self-pay | Admitting: Family Medicine

## 2021-09-17 NOTE — Telephone Encounter (Signed)
It was my understanding that this patient was tapering off If he needs a 30-day supply to help him taper off please do so If he is choosing to stay on it he may have 3 months with a follow-up visit

## 2021-09-17 NOTE — Telephone Encounter (Signed)
LMTRC

## 2022-10-31 ENCOUNTER — Encounter: Payer: Self-pay | Admitting: Family Medicine

## 2022-10-31 ENCOUNTER — Other Ambulatory Visit: Payer: Self-pay | Admitting: Family Medicine

## 2022-10-31 MED ORDER — FLUOXETINE HCL 10 MG PO CAPS: 10.0000 mg | ORAL_CAPSULE | Freq: Every day | ORAL | 0 refills | Status: DC

## 2022-10-31 MED ORDER — ALPRAZOLAM 0.25 MG PO TABS: 0.2500 mg | ORAL_TABLET | Freq: Two times a day (BID) | ORAL | 0 refills | Status: AC | PRN

## 2022-11-08 ENCOUNTER — Ambulatory Visit (INDEPENDENT_AMBULATORY_CARE_PROVIDER_SITE_OTHER): Payer: Self-pay | Admitting: Family Medicine

## 2022-11-08 ENCOUNTER — Encounter: Payer: Self-pay | Admitting: Family Medicine

## 2022-11-08 VITALS — BP 124/80 | HR 59 | Temp 98.6°F | Wt 193.4 lb

## 2022-11-08 DIAGNOSIS — Z131 Encounter for screening for diabetes mellitus: Secondary | ICD-10-CM

## 2022-11-08 DIAGNOSIS — F411 Generalized anxiety disorder: Secondary | ICD-10-CM

## 2022-11-08 DIAGNOSIS — Z23 Encounter for immunization: Secondary | ICD-10-CM

## 2022-11-08 DIAGNOSIS — Z13220 Encounter for screening for lipoid disorders: Secondary | ICD-10-CM

## 2022-11-08 MED ORDER — FLUOXETINE HCL 10 MG PO CAPS: 10.0000 mg | ORAL_CAPSULE | Freq: Every day | ORAL | 1 refills | Status: DC

## 2022-11-08 NOTE — Progress Notes (Signed)
Subjective:    Patient ID: Rickey Wolfe, male    DOB: Jun 25, 1980, 42 y.o.   MRN: [id number]  HPI Patient coming in for anxiety medication. Patient states he was prescribed Xanax one week ago however he has not taking it yet. Patient states he will use it as a PRN basis. Patient states he is taking Prozac. Patient has no further issues or concerns.  Patient not suicidal Patient more so with moderate anxiety anxiousness feeling on edge Recently diagnosed with ADD through a therapist they recommended medications but they tried 3 separate stimulant medicines which causes anxiety to get worse so he stopped taking those  Review of Systems     Objective:   Physical Exam General-in no acute distress Eyes-no discharge Lungs-respiratory rate normal, CTA CV-no murmurs,RRR Extremities skin warm dry no edema Neuro grossly normal Behavior normal, alert        Assessment & Plan:   Anxiety related conditions Should do well with the Prozac Xanax for sparing use Give [place] feedback within 3 weeks Hold off on Strattera it would not get along with the Prozac Give [place] feedback via MyChart also follow-up within 3 months  Patient was told that if he should get worse to follow-up immediately or notify us

## 2022-11-29 ENCOUNTER — Other Ambulatory Visit: Payer: Self-pay | Admitting: Family Medicine

## 2022-12-10 LAB — LIPID PANEL
Chol/HDL Ratio: 4.5 {ratio} (ref 0.0–5.0)
Cholesterol, Total: 253 mg/dL — ABNORMAL HIGH (ref 100–199)
HDL: 56 mg/dL (ref >39)
LDL Chol Calc (NIH): 181 mg/dL — ABNORMAL HIGH (ref 0–99)
Triglycerides: 90 mg/dL (ref 0–149)
VLDL Cholesterol Cal: 16 mg/dL (ref 5–40)

## 2022-12-10 LAB — GLUCOSE, RANDOM: Glucose: 95 mg/dL (ref 70–99)

## 2022-12-14 ENCOUNTER — Ambulatory Visit (INDEPENDENT_AMBULATORY_CARE_PROVIDER_SITE_OTHER): Payer: Self-pay | Admitting: Family Medicine

## 2022-12-14 ENCOUNTER — Encounter: Payer: Self-pay | Admitting: Family Medicine

## 2022-12-14 VITALS — BP 107/72 | HR 61 | Wt 193.8 lb

## 2022-12-14 DIAGNOSIS — Z0001 Encounter for general adult medical examination with abnormal findings: Secondary | ICD-10-CM

## 2022-12-14 DIAGNOSIS — Z0000 Encounter for general adult medical examination without abnormal findings: Secondary | ICD-10-CM

## 2022-12-14 DIAGNOSIS — E785 Hyperlipidemia, unspecified: Secondary | ICD-10-CM | POA: Insufficient documentation

## 2022-12-14 MED ORDER — FLUOXETINE HCL 20 MG PO CAPS: 20.0000 mg | ORAL_CAPSULE | Freq: Every day | ORAL | 1 refills | Status: DC

## 2022-12-14 NOTE — Patient Instructions (Signed)
As we discussed please send me an update in approximately 4 weeks how the new dose of fluoxetine is doing  If you are interested in read looking at your cholesterol in 6 months please send me a message otherwise I would highly recommend to read looking at it again by next year's wellness.  If any questions or concerns let me know [facility]-Dr. Lorin Picket

## 2022-12-14 NOTE — Progress Notes (Signed)
   Subjective:    Patient ID: Rickey Wolfe, male    DOB: August 06, 1980, 42 y.o.   MRN: [id number]  HPI The patient comes in today for a wellness visit.    A review of their health history was completed.  A review of medications was also completed.  Any needed refills; yes  Eating habits: Good eating habits  Falls/  MVA accidents in past few months: No accidents or injuries  Regular exercise: Plays basketball stays fit on a regular basis  Specialist pt sees on regular basis: None  Preventative health issues were discussed.   Additional concerns: No worries currently Patient denies any difficulty breathing with playing basketball stays physically active.  Has some knee pain going back to see orthopedics Tries to be safe in all he does History of depression and anxiety but this is doing fairly well on the Prozac but would like to see if it does better at a higher dose    Review of Systems     Objective:   Physical Exam General-in no acute distress Eyes-no discharge Lungs-respiratory rate normal, CTA CV-no murmurs,RRR Extremities skin warm dry no edema Neuro grossly normal Behavior normal, alert  Patient does not smoke, rarely drinks, tries to eat healthy, denies being depressed, takes Prozac for anxiety related issues would like to try higher dose       Assessment & Plan:  Adult wellness-complete.wellness physical was conducted today. Importance of diet and exercise were discussed in detail.  Importance of stress reduction and healthy living were discussed.  In addition to this a discussion regarding safety was also covered.  We also reviewed over immunizations and gave recommendations regarding current immunization needed for age.   In addition to this additional areas were also touched on including: Preventative health exams needed:  Colonoscopy not indicated currently No family history of prostate cancer Negative for premature heart disease  Patient was  advised yearly wellness exam 20 mg on the Prozac patient to give [place] feedback within 4 to 6 weeks  Hyperlipidemia very important to get LDL under better control healthier diet recommended recheck this again in 6 months time if it is not dramatically better consider statins consider coronary calcium testing in his 40s if LDL gets above 190 definitely on statins as patient gets older if this continues as a trend highly recommend statins  Patient is aware to follow-up lipid profile in 6 months and if for some reason he does not do it then then he will do a follow-up lipid in 12 months with his wellness provide at the moment can hold off on statins but the likelihood of starting statins seems pretty certain as he gets older

## 2022-12-14 NOTE — Progress Notes (Signed)
   Subjective:    Patient ID: Rickey Wolfe, male    DOB: Oct 02, 1980, 42 y.o.   MRN: [id number]  HPI The patient comes in today for a wellness visit.    A review of their health history was completed.  A review of medications was also completed.  Any needed refills; yes  Eating habits: fair  Falls/  MVA accidents in past few months: no  Regular exercise: yes, basketball and tennis  Specialist pt sees on regular basis: no  Preventative health issues were discussed.   Additional concerns: no concerns or issues      Review of Systems     Objective:   Physical Exam        Assessment & Plan:

## 2023-01-11 ENCOUNTER — Encounter: Payer: Self-pay | Admitting: Family Medicine

## 2023-05-22 IMAGING — MR MR KNEE*R* W/O CM
4 of 6 series · 23 of 40 positions shown · non-contrast
Comparison: None.

CLINICAL DATA: Medial right knee pain for 6-12 months. No known
injury.

EXAM:
MRI OF THE RIGHT KNEE WITHOUT CONTRAST
TECHNIQUE: Multiplanar, multisequence MR imaging of the knee was performed. No
intravenous contrast was administered.

[Series 6: T1 · coronal · 4.0mm · 0.29mm/px · 4 of 29 slices shown]
[im 1/29]
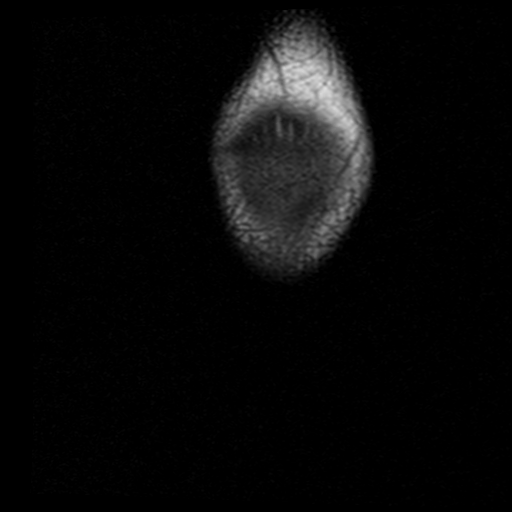
[im 5/29]
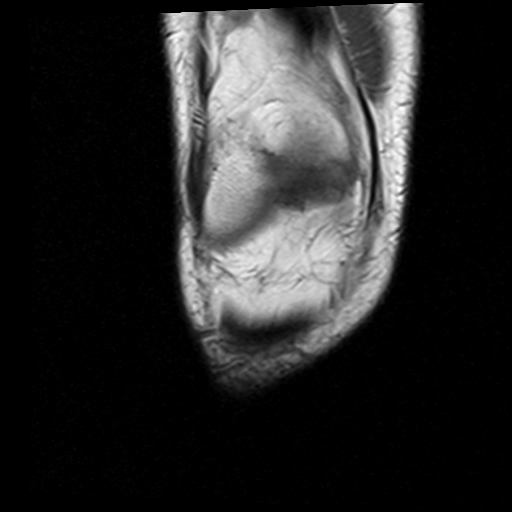
[im 15/29]
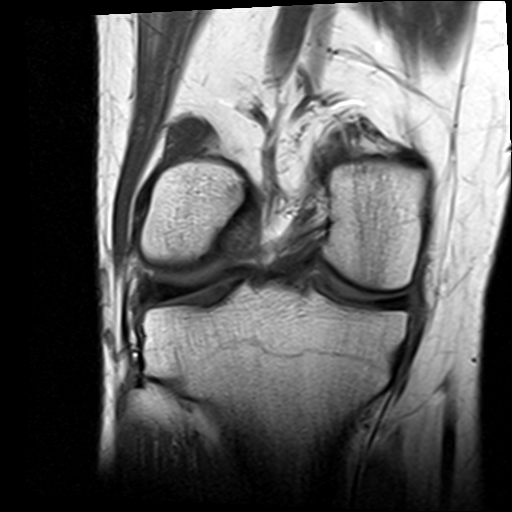
[im 24/29]
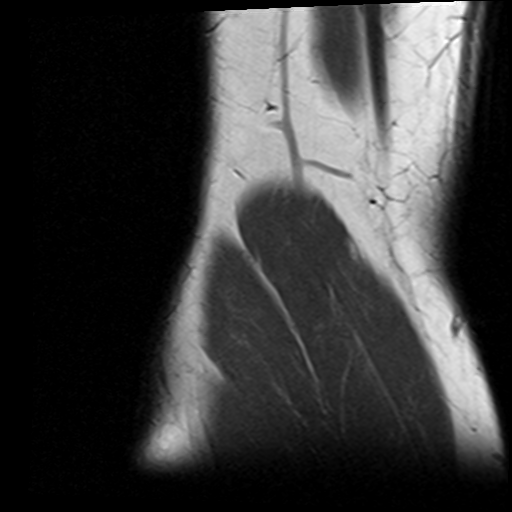

[Series 7: T2 fat-sat · coronal · 4.0mm · 0.59mm/px · 6 of 28 slices shown (1 of 2)]
[im 1/28]
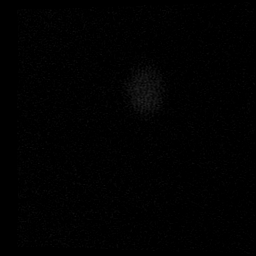
[im 6/28]
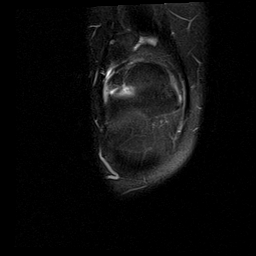
[im 11/28]
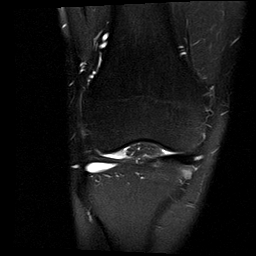
[im 17/28]
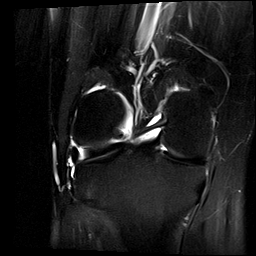
[im 22/28]
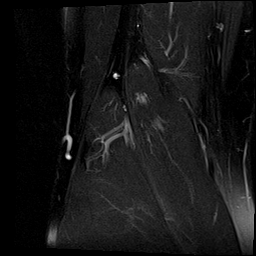
[im 28/28]
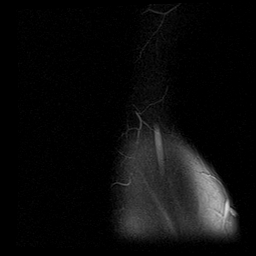

[Series 9: PD fat-sat · sagittal · 3.0mm · 0.29mm/px · 7 of 30 slices shown]
[im 1/30]
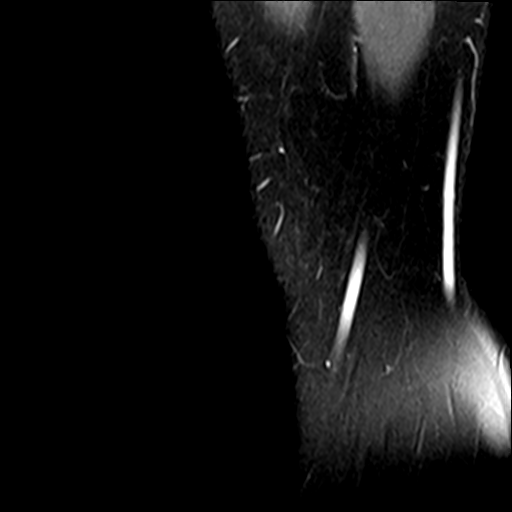
[im 5/30]
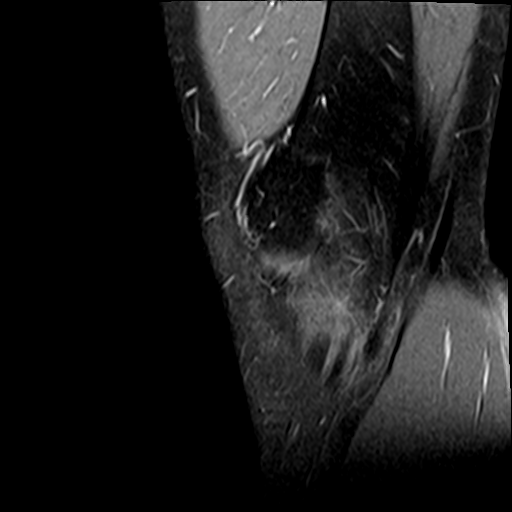
[im 10/30]
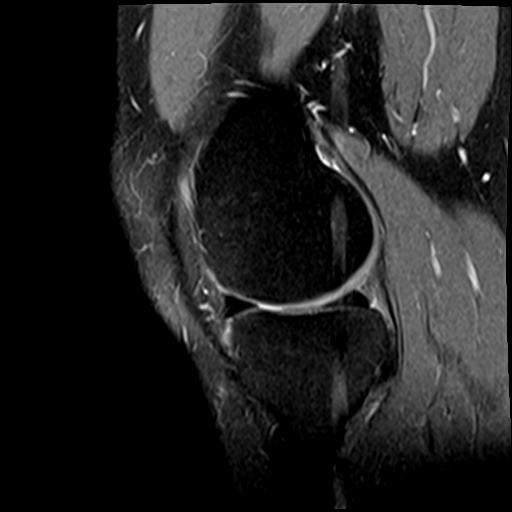
[im 15/30]
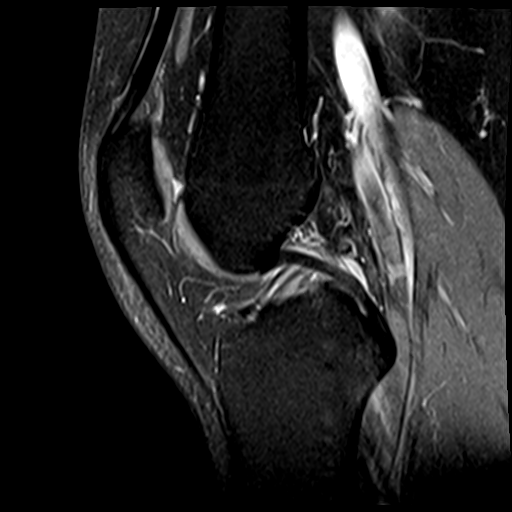
[im 20/30]
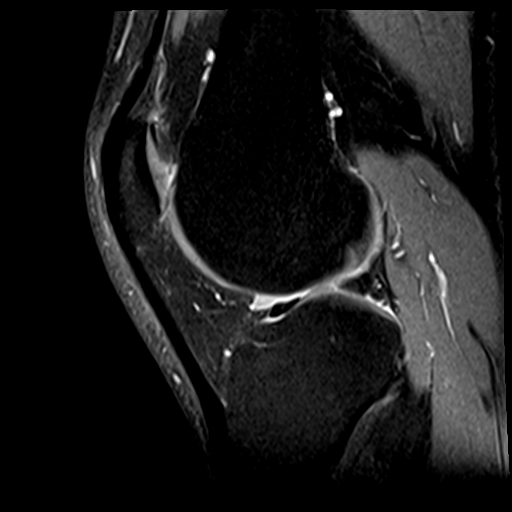
[im 25/30]
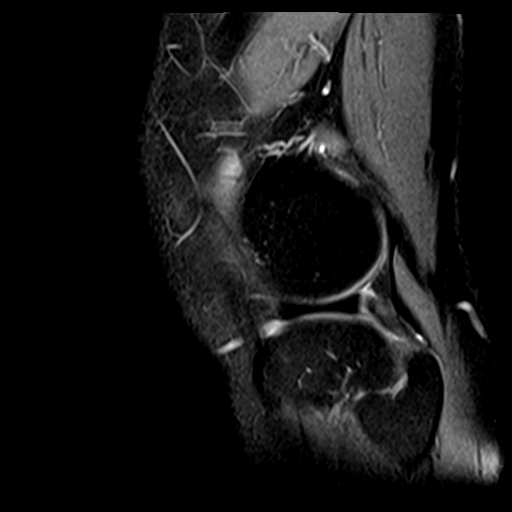
[im 30/30]
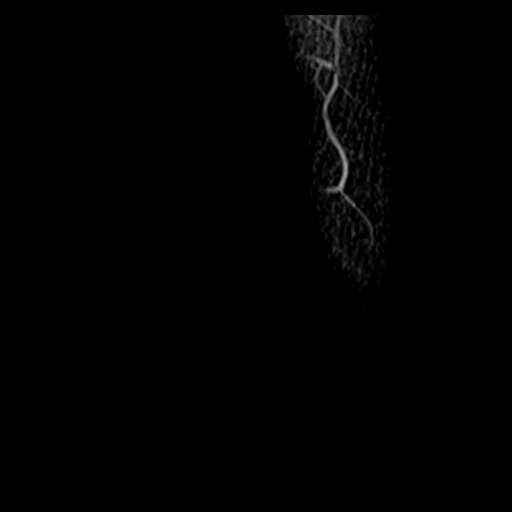

[Series 10: T2 fat-sat · sagittal · 3.0mm · 0.29mm/px · 6 of 28 slices shown (2 of 2)]
[im 1/28]
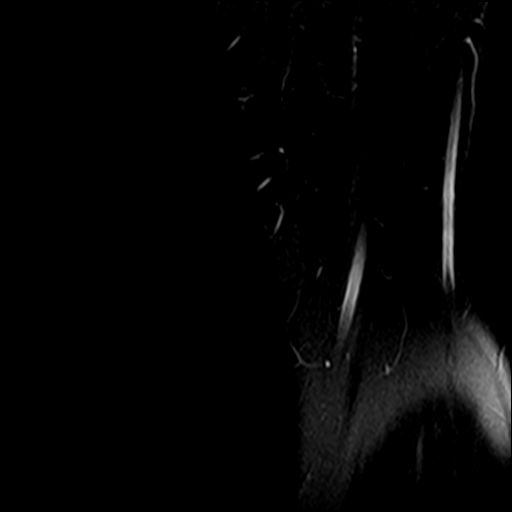
[im 6/28]
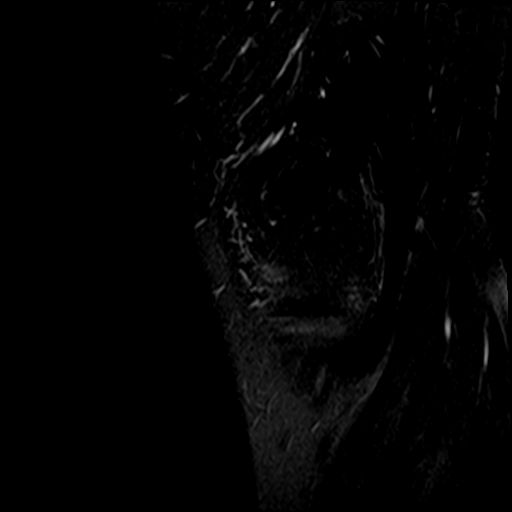
[im 11/28]
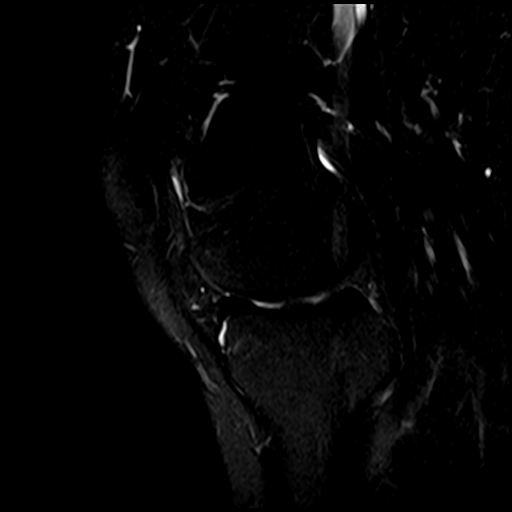
[im 17/28]
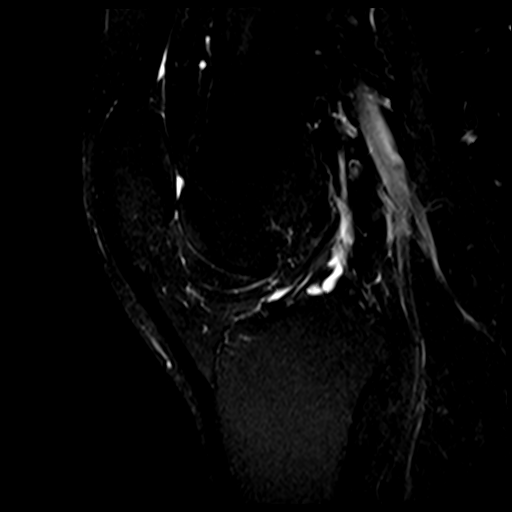
[im 22/28]
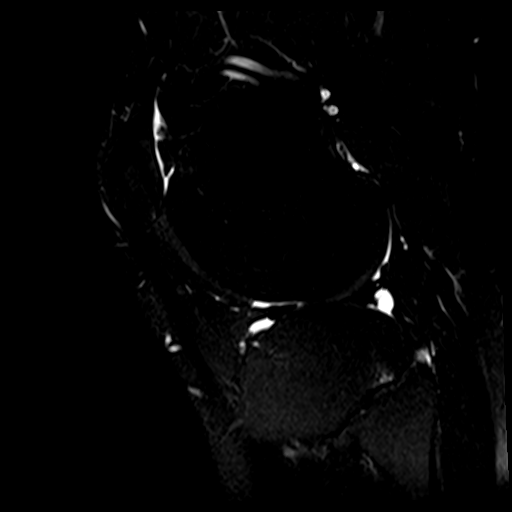
[im 28/28]
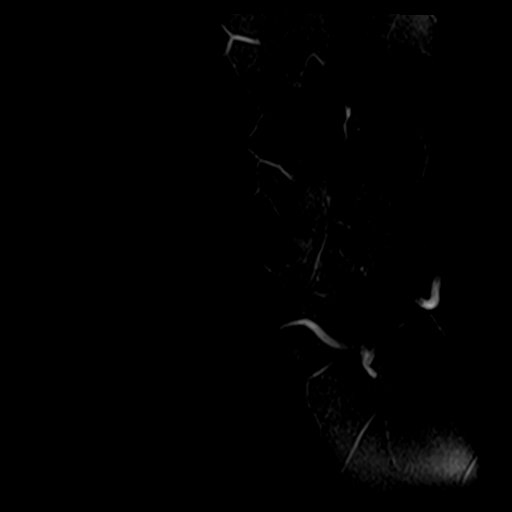

[23 of 40 positions shown; findings below may reference images not displayed]

FINDINGS: MENISCI

Medial meniscus:  Intact.

Lateral meniscus:  Intact.

LIGAMENTS

Cruciates:  Intact.

Collaterals:  Intact.

CARTILAGE

Patellofemoral: There is a blister in hyaline cartilage along the
far periphery of the medial patellar facet adjacent to a medial
plica.

Medial:  Normal.

Lateral:  Normal.

Joint:  Trace amount of joint fluid.

Popliteal Fossa:  No Baker's cyst.

Extensor Mechanism:  Intact.

Bones:  Normal marrow signal throughout.

Other: None.
IMPRESSION: Blister of hyaline cartilage on the far periphery the medial
patellar facet adjacent to a plica compatible with medial plica
syndrome. The exam is otherwise negative.

## 2023-05-31 ENCOUNTER — Other Ambulatory Visit: Payer: Self-pay | Admitting: Family Medicine

## 2023-05-31 ENCOUNTER — Encounter: Payer: Self-pay | Admitting: Family Medicine

## 2023-05-31 MED ORDER — FLUOXETINE HCL 20 MG PO CAPS: 20.0000 mg | ORAL_CAPSULE | Freq: Every day | ORAL | 1 refills | Status: DC

## 2023-11-18 ENCOUNTER — Other Ambulatory Visit: Payer: Self-pay | Admitting: Family Medicine

## 2024-01-09 ENCOUNTER — Encounter: Payer: Self-pay | Admitting: Family Medicine

## 2024-01-09 ENCOUNTER — Ambulatory Visit (INDEPENDENT_AMBULATORY_CARE_PROVIDER_SITE_OTHER): Payer: Self-pay | Admitting: Family Medicine

## 2024-01-09 VITALS — BP 106/68 | HR 63 | Temp 97.9°F | Ht 71.0 in | Wt 205.0 lb

## 2024-01-09 DIAGNOSIS — Z0000 Encounter for general adult medical examination without abnormal findings: Secondary | ICD-10-CM | POA: Diagnosis not present

## 2024-01-09 DIAGNOSIS — Z13220 Encounter for screening for lipoid disorders: Secondary | ICD-10-CM | POA: Diagnosis not present

## 2024-01-09 DIAGNOSIS — Z1159 Encounter for screening for other viral diseases: Secondary | ICD-10-CM

## 2024-01-09 DIAGNOSIS — Z114 Encounter for screening for human immunodeficiency virus [HIV]: Secondary | ICD-10-CM

## 2024-01-09 MED ORDER — FLUOXETINE HCL 20 MG PO CAPS: 20.0000 mg | ORAL_CAPSULE | Freq: Every day | ORAL | 3 refills | Status: AC

## 2024-01-09 NOTE — Progress Notes (Signed)
   Subjective:    Patient ID: Rickey Wolfe, male    DOB: January 03, 1981, 43 y.o.   MRN: [id number] Here for CPE.  HPI The patient comes in today for a wellness visit.    A review of their health history was completed.  A review of medications was also completed.  Any needed refills; patient requests refills  Eating habits: Eating habits are good  Falls/  MVA accidents in past few months: No falls or injury  Regular exercise: Stays physically active with basketball  Specialist pt sees on regular basis: None  Preventative health issues were discussed.   Additional concerns: None Patient does have underlying anxiety he does take antidepressant it does help him he denies being depressed denies being suicidal    Review of Systems     Objective:   Physical Exam Bno General-in no acute distress Eyes-no discharge Lungs-respiratory rate normal, CTA CV-no murmurs,RRR Extremities skin warm dry no edema Neuro grossly normal Behavior normal, alert Prostate exam not indicated Testicular exam normal      01/09/2024    4:19 PM 12/14/2022    9:12 AM 11/11/2020   11:38 AM 02/24/2020    9:06 AM 07/02/2019    8:23 AM  Depression screen PHQ 2/9  Decreased Interest 1 1 0 0 0  Down, Depressed, Hopeless 1 1 0 0 0  PHQ - 2 Score 2 2 0 0 0  Altered sleeping 0 1 0 0 0  Tired, decreased energy 1 0 0 0 0  Change in appetite 0 0 0 0 0  Feeling bad or failure about yourself  1 1 0 0 0  Trouble concentrating 1 1 1 1 1   Moving slowly or fidgety/restless 0 0 0 0 0  Suicidal thoughts 0 0 0 0 0  PHQ-9 Score 5 5  1  1  1    Difficult doing work/chores Somewhat difficult Somewhat difficult Not difficult at all Not difficult at all Not difficult at all     Data saved with a previous flowsheet row definition      01/09/2024    4:20 PM 12/14/2022    9:13 AM 11/11/2020   11:38 AM 02/24/2020    9:07 AM  GAD 7 : Generalized Anxiety Score  Nervous, Anxious, on Edge 0 1 1 0  Control/stop worrying  0 1 0 0  Worry too much - different things 0 1 0 1  Trouble relaxing 0 1 0 0  Restless 0 1 0 0  Easily annoyed or irritable 1 1 1  0  Afraid - awful might happen 1 1 1 1   Total GAD 7 Score 2 7 3 2   Anxiety Difficulty Not difficult at all  Not difficult at all Not difficult at all         Assessment & Plan:  Adult wellness-complete.wellness physical was conducted today. Importance of diet and exercise were discussed in detail.  Importance of stress reduction and healthy living were discussed.  In addition to this a discussion regarding safety was also covered.  We also reviewed over immunizations and gave recommendations regarding current immunization needed for age.   In addition to this additional areas were also touched on including: Preventative health exams needed:  Colonoscopy not indicated  Patient was advised yearly wellness exam Lab work recommended Patient overall doing well on his medication he will let us  know if any setbacks or problems otherwise follow-up in 1 year Wellness in 1 year

## 2024-01-18 LAB — BASIC METABOLIC PANEL WITH GFR
BUN/Creatinine Ratio: 17 (ref 9–20)
BUN: 16 mg/dL (ref 6–24)
CO2: 23 mmol/L (ref 20–29)
Calcium: 9.6 mg/dL (ref 8.7–10.2)
Chloride: 102 mmol/L (ref 96–106)
Creatinine, Ser: 0.96 mg/dL (ref 0.76–1.27)
Glucose: 90 mg/dL (ref 70–99)
Potassium: 4.6 mmol/L (ref 3.5–5.2)
Sodium: 140 mmol/L (ref 134–144)
eGFR: 102 mL/min/1.73 (ref >59)

## 2024-01-18 LAB — HIV ANTIBODY (ROUTINE TESTING W REFLEX): HIV Screen 4th Generation wRfx: NONREACTIVE

## 2024-01-18 LAB — LIPID PANEL
Chol/HDL Ratio: 4.9 ratio (ref 0.0–5.0)
Cholesterol, Total: 272 mg/dL — ABNORMAL HIGH (ref 100–199)
HDL: 55 mg/dL (ref >39)
LDL Chol Calc (NIH): 203 mg/dL — ABNORMAL HIGH (ref 0–99)
Triglycerides: 85 mg/dL (ref 0–149)
VLDL Cholesterol Cal: 14 mg/dL (ref 5–40)

## 2024-01-18 LAB — HEPATITIS C ANTIBODY: Hep C Virus Ab: NONREACTIVE

## 2024-01-18 LAB — LIPOPROTEIN A (LPA): Lipoprotein (a): <8.4 nmol/L (ref <75.0)

## 2024-01-21 ENCOUNTER — Ambulatory Visit: Payer: Self-pay | Admitting: Family Medicine

## 2024-01-31 ENCOUNTER — Other Ambulatory Visit: Payer: Self-pay | Admitting: Family Medicine

## 2024-01-31 DIAGNOSIS — E785 Hyperlipidemia, unspecified: Secondary | ICD-10-CM

## 2024-01-31 DIAGNOSIS — Z79899 Other long term (current) drug therapy: Secondary | ICD-10-CM

## 2024-01-31 NOTE — Telephone Encounter (Signed)
 Nurses I recommend Crestor 20 mg, 1 daily, #30 with 5 refills I would also recommend repeating lipid profile and liver profile by late February or early March in order to make sure that the medication is getting along with his system if he is having any trouble to let us  know thanks-Dr. Glendia  Please have him do lab work as stated above through [facility] His labs have been ordered
